# Patient Record
Sex: Male | Born: 1980 | Race: White | Hispanic: No | Marital: Married | Smoking: Never smoker
Health system: Southern US, Community
[De-identification: ages and names within clinical notes are randomized; demographics above are authoritative.]

## PROBLEM LIST (undated history)

## (undated) DIAGNOSIS — Z789 Other specified health status: Secondary | ICD-10-CM

## (undated) HISTORY — DX: Other specified health status: Z78.9

## (undated) HISTORY — PX: OTHER SURGICAL HISTORY: SHX169

---

## 2003-03-31 ENCOUNTER — Emergency Department (HOSPITAL_COMMUNITY): Admission: EM | Admit: 2003-03-31 | Discharge: 2003-03-31 | Payer: Self-pay | Admitting: Emergency Medicine

## 2005-12-12 ENCOUNTER — Emergency Department (HOSPITAL_COMMUNITY): Admission: EM | Admit: 2005-12-12 | Discharge: 2005-12-12 | Payer: Self-pay | Admitting: Emergency Medicine

## 2019-12-25 ENCOUNTER — Emergency Department (HOSPITAL_BASED_OUTPATIENT_CLINIC_OR_DEPARTMENT_OTHER): Payer: HRSA Program

## 2019-12-25 ENCOUNTER — Emergency Department (HOSPITAL_BASED_OUTPATIENT_CLINIC_OR_DEPARTMENT_OTHER)
Admission: EM | Admit: 2019-12-25 | Discharge: 2019-12-25 | Disposition: A | Discharge status: Home / Self Care | Payer: HRSA Program | Attending: Emergency Medicine | Admitting: Emergency Medicine

## 2019-12-25 ENCOUNTER — Encounter (HOSPITAL_BASED_OUTPATIENT_CLINIC_OR_DEPARTMENT_OTHER): Payer: Self-pay

## 2019-12-25 ENCOUNTER — Other Ambulatory Visit: Payer: Self-pay

## 2019-12-25 DIAGNOSIS — U071 COVID-19: Secondary | ICD-10-CM | POA: Insufficient documentation

## 2019-12-25 DIAGNOSIS — R1031 Right lower quadrant pain: Secondary | ICD-10-CM | POA: Diagnosis present

## 2019-12-25 DIAGNOSIS — R112 Nausea with vomiting, unspecified: Secondary | ICD-10-CM | POA: Diagnosis not present

## 2019-12-25 LAB — COMPREHENSIVE METABOLIC PANEL
ALT: 28 U/L (ref 0–44)
AST: 26 U/L (ref 15–41)
Albumin: 4.4 g/dL (ref 3.5–5.0)
Alkaline Phosphatase: 54 U/L (ref 38–126)
Anion gap: 8 (ref 5–15)
BUN: 15 mg/dL (ref 6–20)
CO2: 25 mmol/L (ref 22–32)
Calcium: 9 mg/dL (ref 8.9–10.3)
Chloride: 104 mmol/L (ref 98–111)
Creatinine, Ser: 1.16 mg/dL (ref 0.61–1.24)
GFR calc Af Amer: >60 mL/min (ref >60)
GFR calc non Af Amer: >60 mL/min (ref >60)
Glucose, Bld: 105 mg/dL — ABNORMAL HIGH (ref 70–99)
Potassium: 3.6 mmol/L (ref 3.5–5.1)
Sodium: 137 mmol/L (ref 135–145)
Total Bilirubin: 1.2 mg/dL (ref 0.3–1.2)
Total Protein: 7.4 g/dL (ref 6.5–8.1)

## 2019-12-25 LAB — CBC
HCT: 44.3 % (ref 39.0–52.0)
Hemoglobin: 15.6 g/dL (ref 13.0–17.0)
MCH: 31.1 pg (ref 26.0–34.0)
MCHC: 35.2 g/dL (ref 30.0–36.0)
MCV: 88.4 fL (ref 80.0–100.0)
Platelets: 195 10*3/uL (ref 150–400)
RBC: 5.01 MIL/uL (ref 4.22–5.81)
RDW: 12.2 % (ref 11.5–15.5)
WBC: 7.4 10*3/uL (ref 4.0–10.5)
nRBC: 0 % (ref 0.0–0.2)

## 2019-12-25 LAB — SARS CORONAVIRUS 2 (TAT 6-24 HRS): SARS Coronavirus 2: POSITIVE — AB

## 2019-12-25 LAB — LIPASE, BLOOD: Lipase: 23 U/L (ref 11–51)

## 2019-12-25 MED ORDER — ONDANSETRON 4 MG PO TBDP: 4.0000 mg | ORAL_TABLET | Freq: Three times a day (TID) | ORAL | 0 refills | Status: DC | PRN

## 2019-12-25 MED ORDER — SODIUM CHLORIDE 0.9 % IV BOLUS
1000.0000 mL | Freq: Once | INTRAVENOUS | Status: AC
  Administered 2019-12-25: 1000 mL via INTRAVENOUS

## 2019-12-25 MED ORDER — ONDANSETRON HCL 4 MG/2ML IJ SOLN
4.0000 mg | Freq: Once | INTRAMUSCULAR | Status: AC
  Administered 2019-12-25: 12:00:00 4 mg via INTRAVENOUS
  Filled 2019-12-25: qty 2

## 2019-12-25 MED ORDER — NAPROXEN 500 MG PO TABS: 500.0000 mg | ORAL_TABLET | Freq: Two times a day (BID) | ORAL | 0 refills | Status: DC

## 2019-12-25 MED ORDER — IOHEXOL 300 MG/ML  SOLN
100.0000 mL | Freq: Once | INTRAMUSCULAR | Status: AC | PRN
  Administered 2019-12-25: 100 mL via INTRAVENOUS

## 2019-12-25 NOTE — ED Triage Notes (Signed)
Pt reports he had his first Covid Vax on Wednesday.

## 2019-12-25 NOTE — Discharge Instructions (Addendum)
You were evaluated in the Emergency Department and after careful evaluation, we did not find any emergent condition requiring admission or further testing in the hospital.  Your exam/testing today was overall reassuring.  No signs of appendicitis on her CT scan.  Symptoms most likely related to gastroenteritis due to a virus.  Please continue home quarantine until you receive a negative coronavirus test result.  Otherwise continue hydration and use the medications provided for your symptoms as needed.  Please return to the Emergency Department if you experience any worsening of your condition.  We encourage you to follow up with a primary care provider.  Thank you for allowing [place] to be a part of your care.

## 2019-12-25 NOTE — ED Provider Notes (Signed)
[facility]PT [facility] Emergency Department Provider Note MRN:  [id number]  Arrival date & time: 12/25/19     Chief Complaint   Abdominal Pain   History of Present Illness   Joel Baldwin is a 39 y.o. year-old male with no pertinent past medical history presenting to the ED with chief complaint of abdominal pain.  Location: Right upper and lower quadrants Duration: 12 hours Onset: Sudden Timing: Current Description: Sharp Severity: Moderate Exacerbating/Alleviating Factors: None Associated Symptoms: Nausea, vomiting, diarrhea, chills, subjective fever Pertinent Negatives: Denies chest pain or shortness of breath   Review of Systems  A complete 10 system review of systems was obtained and all systems are negative except as noted in the HPI and PMH.   Patient's Health History   History reviewed. No pertinent past medical history.  History reviewed. No pertinent surgical history.  No family history on file.  Social History   Socioeconomic History  . Marital status: Married    Spouse name: Not on file  . Number of children: Not on file  . Years of education: Not on file  . Highest education level: Not on file  Occupational History  . Not on file  Tobacco Use  . Smoking status: Never Smoker  . Smokeless tobacco: Never Used  Substance and Sexual Activity  . Alcohol use: Yes    Comment: social  . Drug use: Never  . Sexual activity: Not on file  Other Topics Concern  . Not on file  Social History Narrative  . Not on file   Social Determinants of Health   Financial Resource Strain:   . Difficulty of Paying Living Expenses: Not on file  Food Insecurity:   . Worried About Programme researcher, broadcasting/film/video in the Last Year: Not on file  . Ran Out of Food in the Last Year: Not on file  Transportation Needs:   . Lack of Transportation (Medical): Not on file  . Lack of Transportation (Non-Medical): Not on file  Physical Activity:   . Days of Exercise per  Week: Not on file  . Minutes of Exercise per Session: Not on file  Stress:   . Feeling of Stress : Not on file  Social Connections:   . Frequency of Communication with Friends and Family: Not on file  . Frequency of Social Gatherings with Friends and Family: Not on file  . Attends Religious Services: Not on file  . Active Member of Clubs or Organizations: Not on file  . Attends Banker Meetings: Not on file  . Marital Status: Not on file  Intimate Partner Violence:   . Fear of Current or Ex-Partner: Not on file  . Emotionally Abused: Not on file  . Physically Abused: Not on file  . Sexually Abused: Not on file     Physical Exam   Vitals:   12/25/19 1144  BP: 113/71  Pulse: (!) 103  Resp: 20  Temp: 99.8 F (37.7 C)  SpO2: 99%    CONSTITUTIONAL: Well-appearing, NAD NEURO:  Alert and oriented x 3, no focal deficits EYES:  eyes equal and reactive ENT/NECK:  no LAD, no JVD CARDIO: Regular rate, well-perfused, normal S1 and S2 PULM:  CTAB no wheezing or rhonchi GI/GU:  normal bowel sounds, non-distended, mild right lower quadrant tenderness to palpation MSK/SPINE:  No gross deformities, no edema SKIN:  no rash, atraumatic PSYCH:  Appropriate speech and behavior  *Additional and/or pertinent findings included in MDM below  Diagnostic and Interventional Summary  EKG Interpretation  Date/Time:    Ventricular Rate:    PR Interval:    QRS Duration:   QT Interval:    QTC Calculation:   R Axis:     Text Interpretation:        Cardiac Monitoring Interpretation:  Labs Reviewed  COMPREHENSIVE METABOLIC PANEL - Abnormal; Notable for the following components:      Result Value   Glucose, Bld 105 (*)    All other components within normal limits  SARS CORONAVIRUS 2 (TAT 6-24 HRS)  CBC  LIPASE, BLOOD    CT ABDOMEN PELVIS W CONTRAST  Final Result      Medications  sodium chloride 0.9 % bolus 1,000 mL (1,000 mLs Intravenous New Bag/Given 12/25/19 1321)    ondansetron (ZOFRAN) injection 4 mg (4 mg Intravenous Given 12/25/19 1219)  iohexol (OMNIPAQUE) 300 MG/ML solution 100 mL (100 mLs Intravenous Contrast Given 12/25/19 1257)     Procedures  /  Critical Care Procedures  ED Course and Medical Decision Making  I have reviewed the triage vital signs, the nursing notes, and pertinent available records from the EMR.  Pertinent labs & imaging results that were available during my care of the patient were reviewed by me and considered in my medical decision making (see below for details).     CT to exclude appendicitis, also considering viral gastroenteritis, COVID-19 work-up pending.  2 PM update: Work-up is reassuring, labs normal, CT without acute process.  Suspect underlying gastroenteritis.  Advised continued home quarantine, patient feels well enough for discharge.  Barth Kirks. Sedonia Small, MD [place] [email or web address]  Final Clinical Impressions(s) / ED Diagnoses     ICD-10-CM   1. Nausea vomiting and diarrhea  R11.2    R19.7   2. Right lower quadrant abdominal pain  R10.31     ED Discharge Orders         Ordered    naproxen (NAPROSYN) 500 MG tablet  2 times daily     12/25/19 1408    ondansetron (ZOFRAN ODT) 4 MG disintegrating tablet  Every 8 hours PRN     12/25/19 1408           Discharge Instructions Discussed with and Provided to Patient:     Discharge Instructions     You were evaluated in the Emergency Department and after careful evaluation, we did not find any emergent condition requiring admission or further testing in the hospital.  Your exam/testing today was overall reassuring.  No signs of appendicitis on her CT scan.  Symptoms most likely related to gastroenteritis due to a virus.  Please continue home quarantine until you receive a negative coronavirus test result.  Otherwise continue hydration and use the medications provided for your symptoms as  needed.  Please return to the Emergency Department if you experience any worsening of your condition.  We encourage you to follow up with a primary care provider.  Thank you for allowing [place] to be a part of your care.        Maudie Flakes, MD 12/25/19 1410

## 2019-12-25 NOTE — ED Triage Notes (Signed)
Pt arrives to ED with reports of sudden onset RUQ pain last night from sleep. States he has had 4 episodes of vomiting. 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

## 2019-12-26 ENCOUNTER — Telehealth (HOSPITAL_COMMUNITY): Payer: Self-pay

## 2021-10-20 ENCOUNTER — Encounter (HOSPITAL_BASED_OUTPATIENT_CLINIC_OR_DEPARTMENT_OTHER): Payer: Self-pay | Admitting: Urology

## 2021-10-20 ENCOUNTER — Emergency Department (HOSPITAL_BASED_OUTPATIENT_CLINIC_OR_DEPARTMENT_OTHER)
Admission: EM | Admit: 2021-10-20 | Discharge: 2021-10-20 | Disposition: A | Discharge status: Home / Self Care | Payer: 59 | Attending: Emergency Medicine | Admitting: Emergency Medicine

## 2021-10-20 ENCOUNTER — Emergency Department (HOSPITAL_BASED_OUTPATIENT_CLINIC_OR_DEPARTMENT_OTHER): Payer: 59

## 2021-10-20 DIAGNOSIS — M25571 Pain in right ankle and joints of right foot: Secondary | ICD-10-CM | POA: Diagnosis not present

## 2021-10-20 DIAGNOSIS — S9002XA Contusion of left ankle, initial encounter: Secondary | ICD-10-CM | POA: Diagnosis not present

## 2021-10-20 DIAGNOSIS — Y9383 Activity, rough housing and horseplay: Secondary | ICD-10-CM | POA: Diagnosis not present

## 2021-10-20 DIAGNOSIS — W228XXA Striking against or struck by other objects, initial encounter: Secondary | ICD-10-CM | POA: Diagnosis not present

## 2021-10-20 DIAGNOSIS — S99912A Unspecified injury of left ankle, initial encounter: Secondary | ICD-10-CM | POA: Diagnosis not present

## 2021-10-20 DIAGNOSIS — M7989 Other specified soft tissue disorders: Secondary | ICD-10-CM | POA: Diagnosis not present

## 2021-10-20 DIAGNOSIS — S99911A Unspecified injury of right ankle, initial encounter: Secondary | ICD-10-CM | POA: Diagnosis present

## 2021-10-20 NOTE — ED Triage Notes (Signed)
Left ankle injury, hit it on bed post earlier Minimal swelling Bruising starting Unable to bear weight Ibuprofen 800 at 1800

## 2021-10-20 NOTE — ED Provider Notes (Signed)
[facility] EMERGENCY DEPARTMENT Provider Note   CSN: [id number] Arrival date & time: 10/20/21  1806     History Chief Complaint  Patient presents with   Ankle Pain    Joel Baldwin is a 40 y.o. male.   Ankle Pain  Patient presents with left ankle pain.  Happened acutely when he was roughhousing with his child on the bed.  Hit his ankle against the wood frame, the pain is been progressively worsening throughout the day.  He has taken 800 mg ibuprofen for it which has helped somewhat.  He reports it is difficult to bear weight secondary to the pain.  There is some associated swelling and bruising to the lateral side of his ankle.  The pain distribution is to the lateral and medial side, feels like a shooting pain.  History reviewed. No pertinent past medical history.  There are no problems to display for this patient.   History reviewed. No pertinent surgical history.     History reviewed. No pertinent family history.  Social History   Tobacco Use   Smoking status: Never   Smokeless tobacco: Never  Substance Use Topics   Alcohol use: Yes    Comment: social   Drug use: Never    Home Medications Prior to Admission medications   Medication Sig Start Date End Date Taking? Authorizing Provider  naproxen (NAPROSYN) 500 MG tablet Take 1 tablet (500 mg total) by mouth 2 (two) times daily. 12/25/19   Sabas Sous, MD  ondansetron (ZOFRAN ODT) 4 MG disintegrating tablet Take 1 tablet (4 mg total) by mouth every 8 (eight) hours as needed for nausea or vomiting. 12/25/19   Sabas Sous, MD    Allergies    Patient has no known allergies.  Review of Systems   Review of Systems  Musculoskeletal:  Positive for joint swelling and myalgias.   Physical Exam Updated Vital Signs BP 128/80 (BP Location: Right Arm)    Pulse 89    Temp 98 F (36.7 C) (Oral)    Resp 18    Ht 6\' 1"  (1.854 m)    Wt 97.5 kg    SpO2 100%    BMI 28.37 kg/m   Physical Exam Vitals and  nursing note reviewed. Exam conducted with a chaperone present.  Constitutional:      General: He is not in acute distress.    Appearance: Normal appearance.  HENT:     Head: Normocephalic and atraumatic.  Eyes:     General: No scleral icterus.    Extraocular Movements: Extraocular movements intact.     Pupils: Pupils are equal, round, and reactive to light.  Cardiovascular:     Pulses: Normal pulses.     Comments: DP and PT are 2+ Musculoskeletal:        General: Swelling and tenderness present.     Comments: Decreased flexion extension inversion and eversion at the ankle but he is able to tolerate passive motion.  Full range to the digits, full ROM to the knee  Skin:    Capillary Refill: Capillary refill takes less than 2 seconds.     Coloration: Skin is not jaundiced.     Findings: Bruising present.     Comments: Mild contusion to the lateral malleolus, no point tenderness.  There is some slight swelling as well.  Neurological:     Mental Status: He is alert. Mental status is at baseline.     Coordination: Coordination normal.    ED  Results / Procedures / Treatments   Labs (all labs ordered are listed, but only abnormal results are displayed) Labs Reviewed - No data to display  EKG None  Radiology DG Ankle Complete Left  Result Date: 10/20/2021 CLINICAL DATA:  Left ankle injury with swelling and bruising and unable to bear weight. EXAM: LEFT ANKLE COMPLETE - 3+ VIEW COMPARISON:  None. FINDINGS: There is no evidence of fracture, dislocation, or joint effusion. There is no evidence of arthropathy or other focal bone abnormality. Soft tissues are unremarkable. IMPRESSION: Negative. Electronically Signed   By: Burman Nieves M.D.   On: 10/20/2021 18:46    Procedures Procedures   Medications Ordered in ED Medications - No data to display  ED Course  I have reviewed the triage vital signs and the nursing notes.  Pertinent labs & imaging results that were available  during my care of the patient were reviewed by me and considered in my medical decision making (see chart for details).    MDM Rules/Calculators/A&P                         This is a 40 year old male presenting with acute ankle pain.  He is neurovascularly intact with good pulses and strong cap refill.  Able to tolerate passive range of motion, some decreased active range of motion but still mobilizes.  Not consistent with a septic joint, not consistent with gout given the mechanism.  There is some swelling and bruising, the radiograph ordered in triage is negative for an acute fracture or dislocation.  Patient was able to drive here, he can ambulate in and out.  I do not feel that the patient needs additional work-up at this time, patient offered pain medicine and declined.  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  Work note provided, patient discharged in stable condition.     Final Clinical Impression(s) / ED Diagnoses Final diagnoses:  None    Rx / DC Orders ED Discharge Orders     None        Theron Arista, Cordelia Poche 10/20/21 1916    Gloris Manchester, MD 10/22/21 [phone]

## 2021-10-20 NOTE — Discharge Instructions (Addendum)
Take 800 mg of ibuprofen 3 times daily for the pain. RICE the ankle at home. Return to work on Friday if you are feeling better.  If the pain worsens or changes severely can follow-up with orthopedic specialialist for additional work-up.

## 2022-07-16 DIAGNOSIS — E559 Vitamin D deficiency, unspecified: Secondary | ICD-10-CM | POA: Diagnosis not present

## 2022-07-16 DIAGNOSIS — Z130 Encounter for screening for diseases of the blood and blood-forming organs and certain disorders involving the immune mechanism: Secondary | ICD-10-CM | POA: Diagnosis not present

## 2022-07-16 DIAGNOSIS — Z1329 Encounter for screening for other suspected endocrine disorder: Secondary | ICD-10-CM | POA: Diagnosis not present

## 2022-07-16 DIAGNOSIS — Z13220 Encounter for screening for lipoid disorders: Secondary | ICD-10-CM | POA: Diagnosis not present

## 2022-07-16 DIAGNOSIS — Z13228 Encounter for screening for other metabolic disorders: Secondary | ICD-10-CM | POA: Diagnosis not present

## 2022-07-16 DIAGNOSIS — Z0000 Encounter for general adult medical examination without abnormal findings: Secondary | ICD-10-CM | POA: Diagnosis not present

## 2022-07-16 DIAGNOSIS — E538 Deficiency of other specified B group vitamins: Secondary | ICD-10-CM | POA: Diagnosis not present

## 2023-01-11 ENCOUNTER — Other Ambulatory Visit: Payer: Self-pay | Admitting: Nurse Practitioner

## 2023-03-09 ENCOUNTER — Ambulatory Visit: Payer: Commercial Managed Care - PPO | Admitting: Nurse Practitioner

## 2023-03-09 ENCOUNTER — Encounter: Payer: Self-pay | Admitting: Nurse Practitioner

## 2023-03-09 VITALS — BP 138/78 | HR 82 | Temp 97.7°F | Ht 72.5 in | Wt 216.8 lb

## 2023-03-09 DIAGNOSIS — R030 Elevated blood-pressure reading, without diagnosis of hypertension: Secondary | ICD-10-CM

## 2023-03-09 DIAGNOSIS — Z136 Encounter for screening for cardiovascular disorders: Secondary | ICD-10-CM | POA: Diagnosis not present

## 2023-03-09 DIAGNOSIS — D170 Benign lipomatous neoplasm of skin and subcutaneous tissue of head, face and neck: Secondary | ICD-10-CM | POA: Diagnosis not present

## 2023-03-09 DIAGNOSIS — Z0001 Encounter for general adult medical examination with abnormal findings: Secondary | ICD-10-CM

## 2023-03-09 DIAGNOSIS — E559 Vitamin D deficiency, unspecified: Secondary | ICD-10-CM

## 2023-03-09 DIAGNOSIS — E538 Deficiency of other specified B group vitamins: Secondary | ICD-10-CM

## 2023-03-09 DIAGNOSIS — Z13220 Encounter for screening for lipoid disorders: Secondary | ICD-10-CM | POA: Diagnosis not present

## 2023-03-09 DIAGNOSIS — Z131 Encounter for screening for diabetes mellitus: Secondary | ICD-10-CM

## 2023-03-09 DIAGNOSIS — Z1329 Encounter for screening for other suspected endocrine disorder: Secondary | ICD-10-CM | POA: Diagnosis not present

## 2023-03-09 DIAGNOSIS — Z7689 Persons encountering health services in other specified circumstances: Secondary | ICD-10-CM

## 2023-03-09 DIAGNOSIS — Z79899 Other long term (current) drug therapy: Secondary | ICD-10-CM

## 2023-03-09 DIAGNOSIS — Z1389 Encounter for screening for other disorder: Secondary | ICD-10-CM | POA: Diagnosis not present

## 2023-03-09 LAB — CBC WITH DIFFERENTIAL/PLATELET
Basophils Absolute: 37 cells/uL (ref 0–200)
Eosinophils Absolute: 186 cells/uL (ref 15–500)
Eosinophils Relative: 3 %
HCT: 47.3 % (ref 38.5–50.0)
Lymphs Abs: 2058 cells/uL (ref 850–3900)
MCV: 89.4 fL (ref 80.0–100.0)
MPV: 10.1 fL (ref 7.5–12.5)
Neutrophils Relative %: 54.9 %
RBC: 5.29 10*6/uL (ref 4.20–5.80)
RDW: 13.1 % (ref 11.0–15.0)
Total Lymphocyte: 33.2 %
WBC: 6.2 10*3/uL (ref 3.8–10.8)

## 2023-03-09 NOTE — Progress Notes (Signed)
New Patient Office Visit  Joel Baldwin was seen today to establish care.  Below notes Plan of Care:  1. Encounter to establish care  - CBC with Differential/Platelet - COMPLETE METABOLIC PANEL WITH GFR  2. Encounter for general adult medical examination with abnormal findings Health  maintenance reviewed  - EKG 12-Lead - CBC with Differential/Platelet - COMPLETE METABOLIC PANEL WITH GFR - Lipid panel - TSH - Hemoglobin A1c - Insulin, random - VITAMIN D 25 Hydroxy (Vit-D Deficiency, Fractures) - Urinalysis, Routine w reflex microscopic - Microalbumin / creatinine urine ratio - Vitamin B12  3. Screening for cholesterol level Discussed lifestyle modifications. Recommended diet heavy in fruits and veggies, omega 3's. Decrease consumption of animal meats, cheeses, and dairy products. Remain active and exercise as tolerated.  - Lipid panel  4. Screening for diabetes mellitus (DM) Education: Reviewed 'ABCs' of diabetes management  Discussed goals to be met and/or maintained include A1C (<7) Blood pressure (<130/80) Cholesterol (LDL <70) Continue Eye Exam yearly  Continue Dental Exam Q6 mo Discussed dietary recommendations Discussed Physical Activity recommendations  - Hemoglobin A1c - Insulin, random  5. Screening for thyroid disorder  - TSH  6. Screening for hematuria or proteinuria  - Urinalysis, Routine w reflex microscopic - Microalbumin / creatinine urine ratio  7. Screening for cardiovascular condition  - EKG 12-Lead  8. Elevated blood pressure reading without diagnosis of hypertension Discussed DASH (Dietary Approaches to Stop Hypertension) DASH diet is lower in sodium than a typical American diet. Cut back on foods that are high in saturated fat, cholesterol, and trans fats. Eat more whole-grain foods, fish, poultry, and nuts Remain active and exercise as tolerated daily.  Monitor BP at home-Call if greater than 130/80.   9. Lipoma of  face Follows with Dermatology  Vitamin D deficiency Continue supplement for goal of 60-100 Monitor Vitamin D levels  - VITAMIN D 25 Hydroxy (Vit-D Deficiency, Fractures)  11. B12 deficiency  - Vitamin B12  12. Medication management All medications discussed and reviewed in full. All questions and concerns regarding medications addressed.    - EKG 12-Lead - CBC with Differential/Platelet - COMPLETE METABOLIC PANEL WITH GFR - Lipid panel - TSH - Hemoglobin A1c - Insulin, random - VITAMIN D 25 Hydroxy (Vit-D Deficiency, Fractures) - Urinalysis, Routine w reflex microscopic - Microalbumin / creatinine urine ratio - Vitamin B12   Notify office for further evaluation and treatment, questions or concerns if any reported s/s fail to improve.   The patient was advised to call back or seek an in-person evaluation if any symptoms worsen or if the condition fails to improve as anticipated.   Further disposition pending results of labs. Discussed med's effects and SE's.    I discussed the assessment and treatment plan with the patient. The patient was provided an opportunity to ask questions and all were answered. The patient agreed with the plan and demonstrated an understanding of the instructions.  Discussed med's effects and SE's. Screening labs and tests as requested with regular follow-up as recommended.  I provided 35 minutes of face-to-face time during this encounter including counseling, chart review, and critical decision making was preformed.  Today's Plan of Care is based on a patient-centered health care approach known as shared decision making - the decisions, tests and treatments allow for patient preferences and values to be balanced with clinical evidence.     Subjective    Patient ID: Joel Baldwin, male    DOB: Nov 30, 1980  Age: 42 y.o. 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  He has no new concerns today.    Outpatient Encounter Medications as of 03/09/2023  Medication Sig   naproxen (NAPROSYN) 500 MG tablet Take 1 tablet (500 mg total) by mouth 2 (two) times daily.   ondansetron (ZOFRAN ODT) 4 MG disintegrating tablet Take 1 tablet (4 mg total) by mouth every 8 (eight) hours as needed for nausea or vomiting.   No facility-administered encounter medications on file as of 03/09/2023.    No past medical history on file.  No past surgical history on file.  No family history on file.   Social History   Socioeconomic History   Marital status: Married    Spouse name: Not on file   Number of children: Not on file   Years of education: Not on file   Highest education level: Not on file  Occupational History   Not on file  Tobacco Use   Smoking status: Never   Smokeless tobacco: Never  Substance and Sexual Activity   Alcohol use: Yes    Comment: social   Drug use: Never   Sexual activity: Not on file  Other Topics Concern   Not on file  Social History Narrative   Not on file   Social Determinants of Health   Financial Resource Strain: Not on file  Food Insecurity: Not on file  Transportation Needs: Not on file  Physical Activity: Not on file  Stress: Not on file  Social Connections: Not on file  Intimate Partner Violence: Not on file    Review of Systems  Constitutional: Negative.   HENT: Negative.    Eyes: Negative.   Respiratory: Negative.    Cardiovascular: Negative.   Gastrointestinal: Negative.   Genitourinary: Negative.   Musculoskeletal: Negative.   Skin: Negative.   Neurological: Negative.   Endo/Heme/Allergies: Negative.   Psychiatric/Behavioral: Negative.      Objective    BP 138/78   Pulse 82   Temp 97.7 F (36.5 C)   Ht 6' 0.5" (1.842 m)   Wt 216 lb 12.8 oz (98.3 kg)   SpO2 98%   BMI 29.00 kg/m   Physical Exam Constitutional:       Appearance: Normal appearance.  HENT:     Head: Normocephalic and atraumatic.     Right Ear: Tympanic membrane and ear canal normal.     Left Ear: Tympanic membrane and ear canal normal.  Neurological:     Mental Status: He is alert.    EKG: NSR   No follow-ups on file.   Adela Glimpse, NP

## 2023-03-10 ENCOUNTER — Encounter: Payer: Self-pay | Admitting: Nurse Practitioner

## 2023-03-10 LAB — URINALYSIS, ROUTINE W REFLEX MICROSCOPIC
Bilirubin Urine: NEGATIVE
Glucose, UA: NEGATIVE
Hgb urine dipstick: NEGATIVE
Ketones, ur: NEGATIVE
Leukocytes,Ua: NEGATIVE
Nitrite: NEGATIVE
Protein, ur: NEGATIVE
Specific Gravity, Urine: 1.023 (ref 1.001–1.035)
pH: 6 (ref 5.0–8.0)

## 2023-03-10 LAB — LIPID PANEL
Cholesterol: 171 mg/dL (ref <200)
HDL: 31 mg/dL — ABNORMAL LOW (ref >=40)
LDL Cholesterol (Calc): 95 mg/dL (calc)
Non-HDL Cholesterol (Calc): 140 mg/dL (calc) — ABNORMAL HIGH (ref <130)
Total CHOL/HDL Ratio: 5.5 (calc) — ABNORMAL HIGH (ref <5.0)
Triglycerides: 340 mg/dL — ABNORMAL HIGH (ref <150)

## 2023-03-10 LAB — COMPLETE METABOLIC PANEL WITH GFR
AG Ratio: 2 (calc) (ref 1.0–2.5)
ALT: 21 U/L (ref 9–46)
AST: 25 U/L (ref 10–40)
Albumin: 4.7 g/dL (ref 3.6–5.1)
Alkaline phosphatase (APISO): 59 U/L (ref 36–130)
BUN: 21 mg/dL (ref 7–25)
CO2: 25 mmol/L (ref 20–32)
Calcium: 9.8 mg/dL (ref 8.6–10.3)
Chloride: 107 mmol/L (ref 98–110)
Creat: 1.17 mg/dL (ref 0.60–1.29)
Globulin: 2.4 g/dL (calc) (ref 1.9–3.7)
Glucose, Bld: 98 mg/dL (ref 65–99)
Potassium: 4.1 mmol/L (ref 3.5–5.3)
Sodium: 143 mmol/L (ref 135–146)
Total Bilirubin: 0.4 mg/dL (ref 0.2–1.2)
Total Protein: 7.1 g/dL (ref 6.1–8.1)
eGFR: 81 mL/min/{1.73_m2} (ref >=60)

## 2023-03-10 LAB — INSULIN, RANDOM: Insulin: 33.1 u[IU]/mL — ABNORMAL HIGH

## 2023-03-10 LAB — CBC WITH DIFFERENTIAL/PLATELET
Absolute Monocytes: 515 cells/uL (ref 200–950)
Basophils Relative: 0.6 %
Hemoglobin: 16.4 g/dL (ref 13.2–17.1)
MCH: 31 pg (ref 27.0–33.0)
MCHC: 34.7 g/dL (ref 32.0–36.0)
Monocytes Relative: 8.3 %
Neutro Abs: 3404 cells/uL (ref 1500–7800)
Platelets: 226 10*3/uL (ref 140–400)

## 2023-03-10 LAB — TSH: TSH: 1.21 mIU/L (ref 0.40–4.50)

## 2023-03-10 LAB — MICROALBUMIN / CREATININE URINE RATIO
Creatinine, Urine: 163 mg/dL (ref 20–320)
Microalb, Ur: <0.2 mg/dL

## 2023-03-10 LAB — VITAMIN D 25 HYDROXY (VIT D DEFICIENCY, FRACTURES): Vit D, 25-Hydroxy: 25 ng/mL — ABNORMAL LOW (ref 30–100)

## 2023-03-10 LAB — HEMOGLOBIN A1C
Hgb A1c MFr Bld: 5 % of total Hgb (ref <5.7)
Mean Plasma Glucose: 97 mg/dL
eAG (mmol/L): 5.4 mmol/L

## 2023-03-10 LAB — VITAMIN B12: Vitamin B-12: 251 pg/mL (ref 200–1100)

## 2023-03-15 ENCOUNTER — Encounter: Payer: Self-pay | Admitting: Nurse Practitioner

## 2023-03-15 DIAGNOSIS — D170 Benign lipomatous neoplasm of skin and subcutaneous tissue of head, face and neck: Secondary | ICD-10-CM | POA: Insufficient documentation

## 2023-03-15 DIAGNOSIS — R030 Elevated blood-pressure reading, without diagnosis of hypertension: Secondary | ICD-10-CM | POA: Insufficient documentation

## 2023-05-05 ENCOUNTER — Other Ambulatory Visit: Payer: Self-pay | Admitting: Oncology

## 2023-05-05 DIAGNOSIS — Z006 Encounter for examination for normal comparison and control in clinical research program: Secondary | ICD-10-CM

## 2023-08-06 IMAGING — DX DG ANKLE COMPLETE 3+V*L*
3 series · 3 of 3 positions shown · non-contrast
Comparison: None.

CLINICAL DATA: Left ankle injury with swelling and bruising and
unable to bear weight.

EXAM:
LEFT ANKLE COMPLETE - 3+ VIEW

[ankle ap]
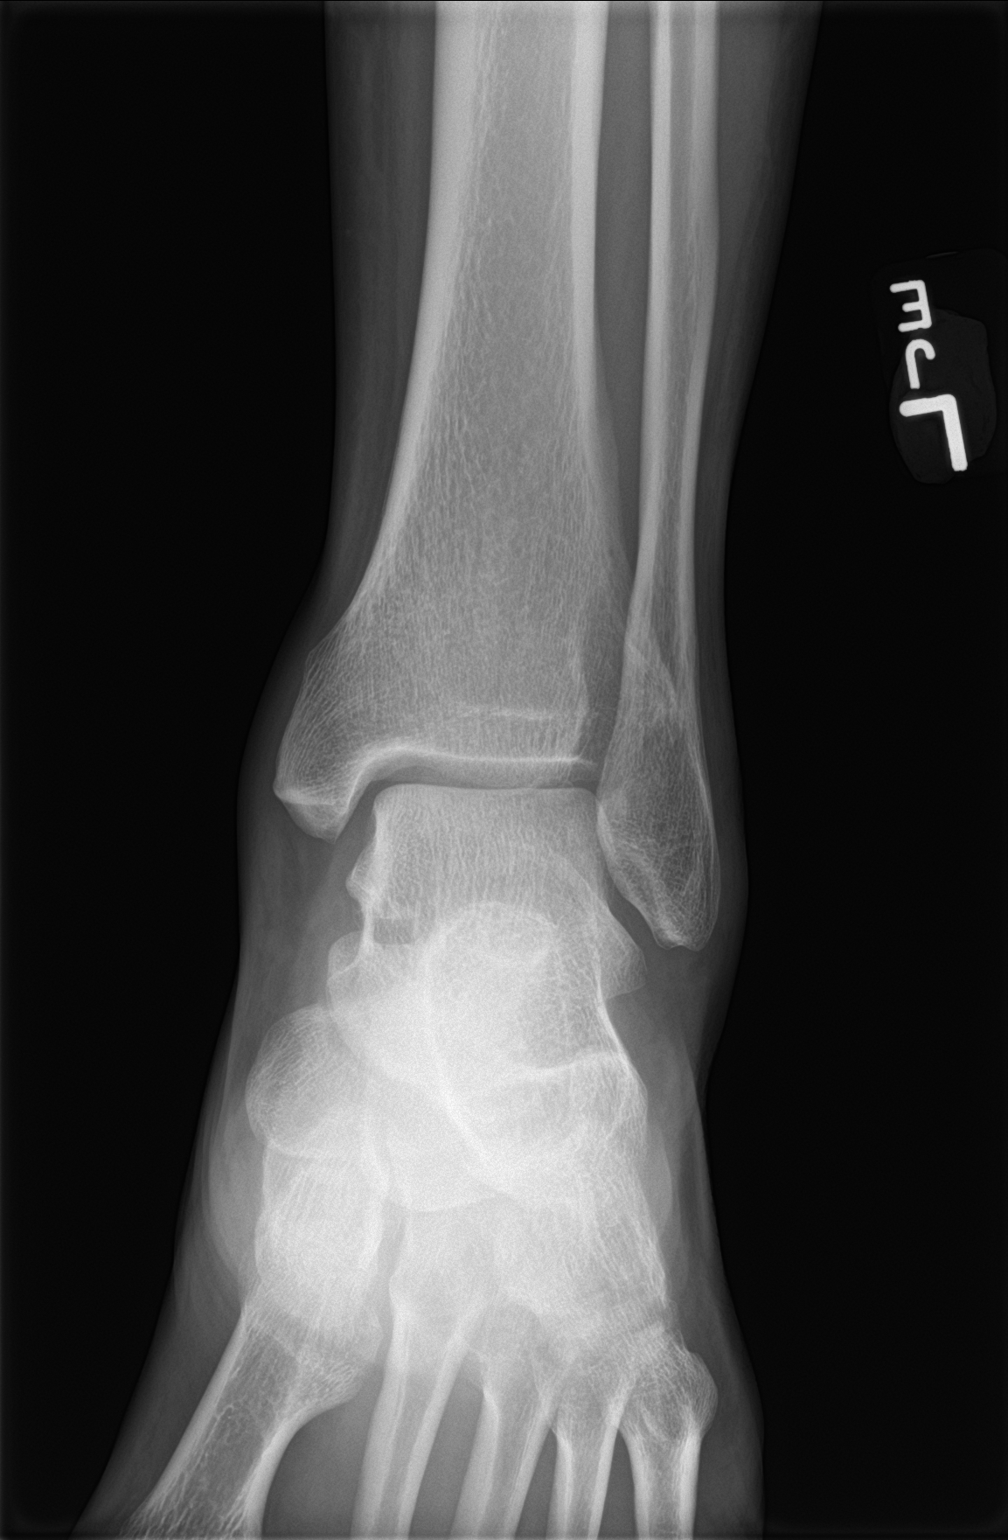

[ankle obl]
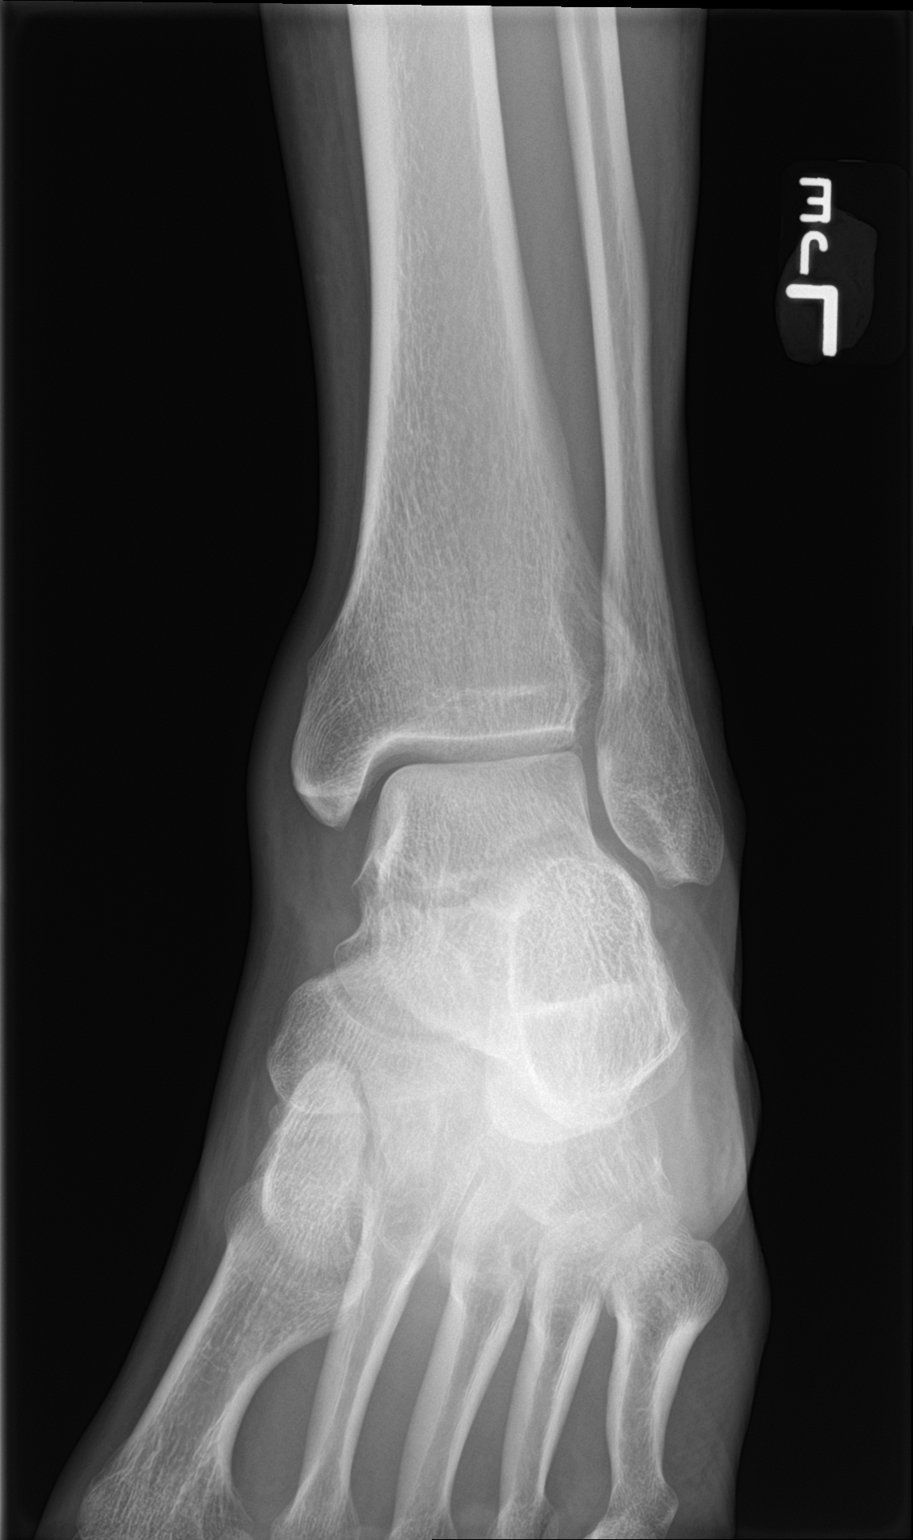

[ankle lat]
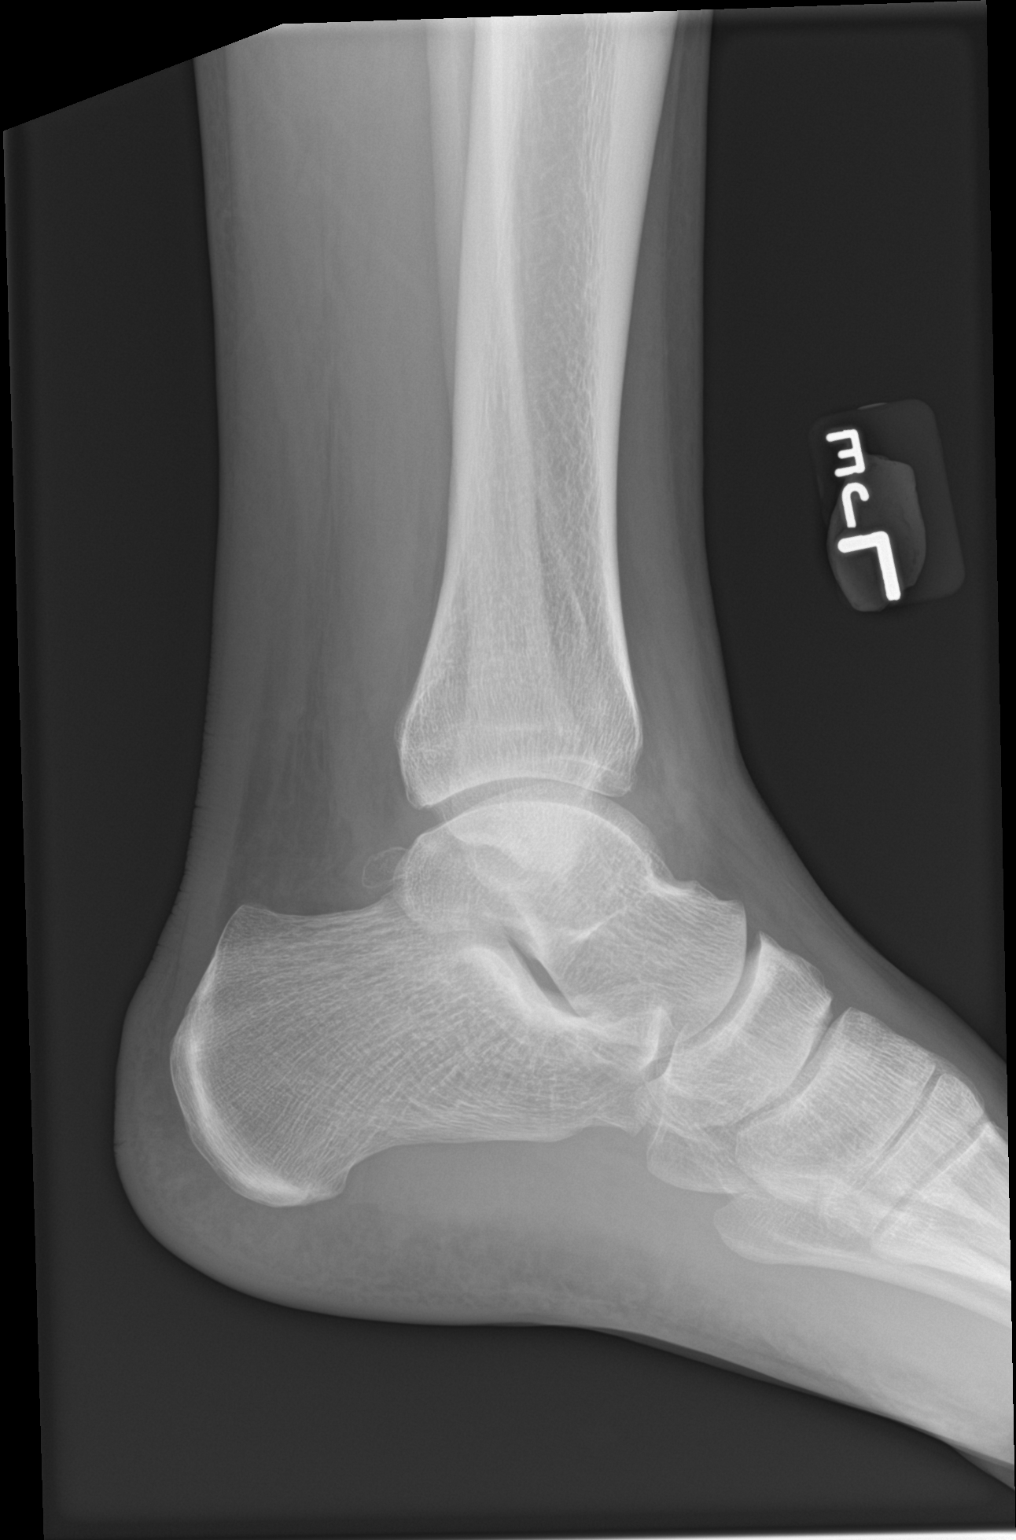

[3 of 3 positions shown; findings below may reference images not displayed]

FINDINGS: There is no evidence of fracture, dislocation, or joint effusion.
There is no evidence of arthropathy or other focal bone abnormality.
Soft tissues are unremarkable.
IMPRESSION: Negative.

## 2023-08-31 ENCOUNTER — Other Ambulatory Visit (HOSPITAL_COMMUNITY): Payer: Self-pay

## 2023-09-09 ENCOUNTER — Other Ambulatory Visit (HOSPITAL_COMMUNITY)
Admission: RE | Admit: 2023-09-09 | Discharge: 2023-09-09 | Disposition: A | Discharge status: Home / Self Care | Payer: Self-pay | Source: Ambulatory Visit | Attending: Oncology | Admitting: Oncology

## 2023-09-09 DIAGNOSIS — Z006 Encounter for examination for normal comparison and control in clinical research program: Secondary | ICD-10-CM | POA: Insufficient documentation

## 2023-09-21 LAB — GENECONNECT MOLECULAR SCREEN: Genetic Analysis Overall Interpretation: NEGATIVE

## 2024-01-26 ENCOUNTER — Encounter: Payer: Self-pay | Admitting: Physician Assistant

## 2024-02-02 ENCOUNTER — Encounter: Payer: Self-pay | Admitting: Physician Assistant

## 2024-02-02 ENCOUNTER — Ambulatory Visit: Admitting: Physician Assistant

## 2024-02-02 VITALS — BP 126/78 | HR 67 | Ht 72.5 in | Wt 215.6 lb

## 2024-02-02 DIAGNOSIS — R351 Nocturia: Secondary | ICD-10-CM

## 2024-02-02 DIAGNOSIS — R3915 Urgency of urination: Secondary | ICD-10-CM | POA: Diagnosis not present

## 2024-02-02 LAB — POCT URINALYSIS DIP (MANUAL ENTRY)
Bilirubin, UA: NEGATIVE
Blood, UA: NEGATIVE
Glucose, UA: NEGATIVE mg/dL
Ketones, POC UA: NEGATIVE mg/dL
Nitrite, UA: NEGATIVE
Protein Ur, POC: NEGATIVE mg/dL
Spec Grav, UA: <=1.005 — AB (ref 1.010–1.025)
Urobilinogen, UA: 0.2 U/dL
pH, UA: 6 (ref 5.0–8.0)

## 2024-02-02 LAB — PSA: PSA: 0.36 ng/mL (ref 0.10–4.00)

## 2024-02-02 NOTE — Progress Notes (Signed)
 New patient visit   Patient: Joel Baldwin   DOB: September 23, 1981   43 y.o. Male  MRN: [id number] Visit Date: 02/02/2024  Today's healthcare provider: Alfredia Ferguson, PA-C   Cc. New patient, establish care  Subjective    Joel Baldwin is a 43 y.o. male who presents today as a new patient to establish care.   Discussed the use of AI scribe software for clinical note transcription with the patient, who gave verbal consent to proceed.  History of Present Illness   The patient, a nurse, presents with a chronic issue of urinary urgency that has been ongoing for about five years. The patient reports an immediate need to urinate, often having to go to the restroom right away. This urgency is not dependent on the situation and occurs daily. The patient also reports waking up two to three times at night to urinate, which has been affecting his sleep cycle for the past year. The patient denies any issues with the urinary stream or bowel movements. The patient does not take any medications and has no surgical history. The patient does not smoke or vape.       History reviewed. No pertinent past medical history. History reviewed. No pertinent surgical history. Family Status  Relation Name Status   Mother  Alive   Father  Alive   MGM  (Not Specified)   MGF  (Not Specified)  No partnership data on file   Family History  Problem Relation Age of Onset   Breast cancer Maternal Grandmother    Cancer Maternal Grandfather        lungs, met to brain   Social History   Socioeconomic History   Marital status: Married    Spouse name: Not on file   Number of children: Not on file   Years of education: Not on file   Highest education level: Bachelor's degree (e.g., BA, AB, BS)  Occupational History   Not on file  Tobacco Use   Smoking status: Never   Smokeless tobacco: Never  Vaping Use   Vaping status: Never Used  Substance and Sexual Activity   Alcohol use: Yes    Alcohol/week:  4.0 standard drinks of alcohol    Types: 2 Cans of beer, 2 Standard drinks or equivalent per week    Comment: social   Drug use: Never   Sexual activity: Yes    Birth control/protection: None  Other Topics Concern   Not on file  Social History Narrative   Not on file   Social Drivers of Health   Financial Resource Strain: Low Risk  (02/02/2024)   Overall Financial Resource Strain (CARDIA)    Difficulty of Paying Living Expenses: Not hard at all  Food Insecurity: No Food Insecurity (02/02/2024)   Hunger Vital Sign    Worried About Running Out of Food in the Last Year: Never true    Ran Out of Food in the Last Year: Never true  Transportation Needs: No Transportation Needs (02/02/2024)   PRAPARE - Administrator, Civil Service (Medical): No    Lack of Transportation (Non-Medical): No  Physical Activity: Sufficiently Active (02/02/2024)   Exercise Vital Sign    Days of Exercise per Week: 5 days    Minutes of Exercise per Session: 40 min  Stress: No Stress Concern Present (02/02/2024)   [employer] of Occupational Health - Occupational Stress Questionnaire    Feeling of Stress : Not at all  Social Connections: Moderately Isolated (  02/02/2024)   Social Connection and Isolation Panel [NHANES]    Frequency of Communication with Friends and Family: More than three times a week    Frequency of Social Gatherings with Friends and Family: Twice a week    Attends Religious Services: Never    Database administrator or Organizations: No    Attends Engineer, structural: Not on file    Marital Status: Married   No outpatient medications prior to visit.   No facility-administered medications prior to visit.   No Known Allergies  Immunization History  Administered Date(s) Administered   Tdap 01/30/2013    Health Maintenance  Topic Date Due   COVID-19 Vaccine (1) Never done   HIV Screening  Never done   Hepatitis C Screening  Never done   DTaP/Tdap/Td (2 - Td or  Tdap) 01/31/2023   INFLUENZA VACCINE  05/26/2024   HPV VACCINES  Aged Out    Patient Care Team: Lucky Cowboy, MD as PCP - General (Internal Medicine)  Review of Systems  Constitutional:  Negative for fatigue and fever.  Respiratory:  Negative for cough and shortness of breath.   Cardiovascular:  Negative for chest pain, palpitations and leg swelling.  Genitourinary:  Positive for urgency.  Neurological:  Negative for dizziness and headaches.        Objective    BP 126/78   Pulse 67   Ht 6' 0.5" (1.842 m)   Wt 215 lb 9.6 oz (97.8 kg)   BMI 28.84 kg/m    Physical Exam Constitutional:      General: He is awake.     Appearance: He is well-developed.  HENT:     Head: Normocephalic.  Eyes:     Conjunctiva/sclera: Conjunctivae normal.  Cardiovascular:     Rate and Rhythm: Normal rate and regular rhythm.     Heart sounds: Normal heart sounds.  Pulmonary:     Effort: Pulmonary effort is normal.     Breath sounds: Normal breath sounds.  Skin:    General: Skin is warm.  Neurological:     Mental Status: He is alert and oriented to person, place, and time.  Psychiatric:        Attention and Perception: Attention normal.        Mood and Affect: Mood normal.        Speech: Speech normal.        Behavior: Behavior is cooperative.     Depression Screen     No data to display         No results found for any visits on 02/02/24.  Assessment & Plan     Nocturia -     PSA -     POCT urinalysis dipstick -     Ambulatory referral to Urology  Urinary urgency -     PSA -     POCT urinalysis dipstick -     Ambulatory referral to Urology   Chronic urinary urgency and nocturia for approximately five years, with increased frequency over the past year.  - Order PSA and urinalysis - Refer to urology for further evaluation    Return in about 3 months (around 05/03/2024) for CPE.      Alfredia Ferguson, PA-C  [facility] at [facility] [phone] (phone) [phone] (fax)  [facility]

## 2024-02-03 ENCOUNTER — Encounter: Payer: Self-pay | Admitting: Physician Assistant

## 2024-02-03 LAB — URINE CULTURE
MICRO NUMBER:: 16308409
Result:: NO GROWTH
SPECIMEN QUALITY:: ADEQUATE

## 2024-02-18 ENCOUNTER — Ambulatory Visit: Admitting: Urology

## 2024-02-18 ENCOUNTER — Encounter: Payer: Self-pay | Admitting: Urology

## 2024-02-18 VITALS — BP 127/76 | HR 71 | Ht 72.0 in | Wt 210.0 lb

## 2024-02-18 DIAGNOSIS — N3281 Overactive bladder: Secondary | ICD-10-CM | POA: Diagnosis not present

## 2024-02-18 DIAGNOSIS — R399 Unspecified symptoms and signs involving the genitourinary system: Secondary | ICD-10-CM

## 2024-02-18 LAB — URINALYSIS, ROUTINE W REFLEX MICROSCOPIC
Bilirubin, UA: NEGATIVE
Glucose, UA: NEGATIVE
Ketones, UA: NEGATIVE
Leukocytes,UA: NEGATIVE
Nitrite, UA: NEGATIVE
Protein,UA: NEGATIVE
RBC, UA: NEGATIVE
Specific Gravity, UA: 1.015 (ref 1.005–1.030)
Urobilinogen, Ur: 0.2 mg/dL (ref 0.2–1.0)
pH, UA: 5.5 (ref 5.0–7.5)

## 2024-02-18 LAB — BLADDER SCAN AMB NON-IMAGING

## 2024-02-18 MED ORDER — GEMTESA 75 MG PO TABS: 75.0000 mg | ORAL_TABLET | Freq: Every day | ORAL | Status: DC

## 2024-02-18 NOTE — Progress Notes (Signed)
 Assessment: 1. Lower urinary tract symptoms (LUTS)   2. OAB (overactive bladder)     Plan: I personally reviewed the patient's chart including provider notes, and lab results. Diagnosis and management of overactive bladder discussed with the patient in detail today.  Options for management including avoidance of dietary irritants, behavioral modification, medical therapy, neuromodulation, and chemodenervation discussed. Bladder diet sheet given. Trial of Gemtesa  75 mg daily. Samples given. Return to office in 1 month  Chief Complaint:  Chief Complaint  Patient presents with   LUTS    History of Present Illness:  Joel Baldwin is a 43 y.o. male who is seen in consultation from [place], [place] for evaluation of lower urinary tract symptoms. He has had symptoms for approximately 5 years.  He reports urinary frequency voiding every 2 hours, significant urgency, and nocturia x 2.  He voids with a good stream and feels like he empties his bladder completely.  No dysuria or gross hematuria.  The urgency is the most problematic for him.  He has not had any incontinence.  No history of UTIs or prostatitis.  No prior medical therapy for his symptoms. IPSS = 11/4 today.  PSA 4/25:  0.36  He primarily drinks water.  He does not have significant caffeine intake.  Past Medical History:  Past Medical History:  Diagnosis Date   No known problems     Past Surgical History:  Past Surgical History:  Procedure Laterality Date   None      Allergies:  No Known Allergies  Family History:  Family History  Problem Relation Age of Onset   Breast cancer Maternal Grandmother    Cancer Maternal Grandfather        lungs, met to brain    Social History:  Social History   Tobacco Use   Smoking status: Never   Smokeless tobacco: Never  Vaping Use   Vaping status: Never Used  Substance Use Topics   Alcohol use: Yes    Alcohol/week: 4.0 standard drinks of alcohol    Types: 2  Cans of beer, 2 Standard drinks or equivalent per week    Comment: social   Drug use: Never    Review of symptoms:  Constitutional:  Negative for unexplained weight loss, night sweats, fever, chills ENT:  Negative for nose bleeds, sinus pain, painful swallowing CV:  Negative for chest pain, shortness of breath, exercise intolerance, palpitations, loss of consciousness Resp:  Negative for cough, wheezing, shortness of breath GI:  Negative for nausea, vomiting, diarrhea, bloody stools GU:  Positives noted in HPI; otherwise negative for gross hematuria, dysuria, urinary incontinence Neuro:  Negative for seizures, poor balance, limb weakness, slurred speech Psych:  Negative for lack of energy, depression, anxiety Endocrine:  Negative for polydipsia, polyuria, symptoms of hypoglycemia (dizziness, hunger, sweating) Hematologic:  Negative for anemia, purpura, petechia, prolonged or excessive bleeding, use of anticoagulants  Allergic:  Negative for difficulty breathing or choking as a result of exposure to anything; no shellfish allergy; no allergic response (rash/itch) to materials, foods  Physical exam: BP 127/76   Pulse 71   Ht 6' (1.829 m)   Wt 210 lb (95.3 kg)   BMI 28.48 kg/m  GENERAL APPEARANCE:  Well appearing, well developed, well nourished, NAD HEENT: Atraumatic, Normocephalic, oropharynx clear. NECK: Supple without lymphadenopathy or thyromegaly. LUNGS: Clear to auscultation bilaterally. HEART: Regular Rate and Rhythm without murmurs, gallops, or rubs. ABDOMEN: Soft, non-tender, No Masses. EXTREMITIES: Moves all extremities well.  Without clubbing, cyanosis,  or edema. NEUROLOGIC:  Alert and oriented x 3, normal gait, CN II-XII grossly intact.  MENTAL STATUS:  Appropriate. BACK:  Non-tender to palpation.  No CVAT SKIN:  Warm, dry and intact.   GU: Penis:  circumcised Meatus: Normal Scrotum: normal, no masses Testis: normal without masses bilateral Epididymis:  normal   Results: U/A: negative  PVR =  1 ml

## 2024-03-13 ENCOUNTER — Encounter: Payer: Commercial Managed Care - PPO | Admitting: Nurse Practitioner

## 2024-04-04 ENCOUNTER — Encounter: Payer: Self-pay | Admitting: Urology

## 2024-04-04 ENCOUNTER — Ambulatory Visit (INDEPENDENT_AMBULATORY_CARE_PROVIDER_SITE_OTHER): Admitting: Urology

## 2024-04-04 VITALS — BP 123/78 | HR 61 | Ht 73.0 in | Wt 215.0 lb

## 2024-04-04 DIAGNOSIS — N3281 Overactive bladder: Secondary | ICD-10-CM

## 2024-04-04 DIAGNOSIS — R399 Unspecified symptoms and signs involving the genitourinary system: Secondary | ICD-10-CM

## 2024-04-04 LAB — URINALYSIS, ROUTINE W REFLEX MICROSCOPIC
Bilirubin, UA: NEGATIVE
Glucose, UA: NEGATIVE
Ketones, UA: NEGATIVE
Leukocytes,UA: NEGATIVE
Nitrite, UA: NEGATIVE
Protein,UA: NEGATIVE
RBC, UA: NEGATIVE
Specific Gravity, UA: 1.01 (ref 1.005–1.030)
Urobilinogen, Ur: 0.2 mg/dL (ref 0.2–1.0)
pH, UA: 6 (ref 5.0–7.5)

## 2024-04-04 MED ORDER — GEMTESA 75 MG PO TABS: 75.0000 mg | ORAL_TABLET | Freq: Every day | ORAL | 5 refills | Status: DC

## 2024-04-04 NOTE — Progress Notes (Signed)
 Assessment: 1. Lower urinary tract symptoms (LUTS)   2. OAB (overactive bladder)     Plan: Continue bladder diet  Will observe for any recurrence of symptoms at this time. I gave him 2 weeks of samples of Gemtesa  as well as a prescription if he has recurrence of his symptoms and needs to restart the medication. Return to office as needed.  Chief Complaint:  Chief Complaint  Patient presents with   Over Active Bladder    History of Present Illness:  Joel Baldwin is a 43 y.o. male who is seen for further evaluation of lower urinary tract symptoms. At his initial visit in April 2025, he reported lower urinary tract symptoms for approximately 5 years.  Symptoms include urinary frequency voiding every 2 hours, significant urgency, and nocturia x 2.  He voided with a good stream and felt like he emptied his bladder completely.  No dysuria or gross hematuria.  The urgency was the most problematic symptom for him.  No urinary incontinence.  No history of UTIs or prostatitis.  No prior medical therapy for his symptoms. IPSS = 11/4. PVR = 1 ml  PSA 4/25:  0.36  He primarily drinks water.  He does not have significant caffeine intake.  He was given a trial of Gemtesa  75 mg daily in April 2025.  He returns today for follow-up.  He ran out of the Gemtesa  approximately 1 week ago.  He has not seen any recurrence of his lower urinary tract symptoms despite being off of the medication.  He is not having any significant frequency or urgency.  He continues with nocturia x 2.  No dysuria or gross hematuria. IPSS = 9/2  Portions of the above documentation were copied from a prior visit for review purposes only.   Past Medical History:  Past Medical History:  Diagnosis Date   No known problems     Past Surgical History:  Past Surgical History:  Procedure Laterality Date   None      Allergies:  No Known Allergies  Family History:  Family History  Problem Relation Age of Onset    Breast cancer Maternal Grandmother    Cancer Maternal Grandfather        lungs, met to brain    Social History:  Social History   Tobacco Use   Smoking status: Never   Smokeless tobacco: Never  Vaping Use   Vaping status: Never Used  Substance Use Topics   Alcohol use: Yes    Alcohol/week: 4.0 standard drinks of alcohol    Types: 2 Cans of beer, 2 Standard drinks or equivalent per week    Comment: social   Drug use: Never    ROS: Constitutional:  Negative for fever, chills, weight loss CV: Negative for chest pain, previous MI, hypertension Respiratory:  Negative for shortness of breath, wheezing, sleep apnea, frequent cough GI:  Negative for nausea, vomiting, bloody stool, GERD  Physical exam: BP 123/78   Pulse 61   Ht 6\' 1"  (1.854 m)   Wt 215 lb (97.5 kg)   BMI 28.37 kg/m  GENERAL APPEARANCE:  Well appearing, well developed, well nourished, NAD HEENT:  Atraumatic, normocephalic, oropharynx clear NECK:  Supple without lymphadenopathy or thyromegaly ABDOMEN:  Soft, non-tender, no masses EXTREMITIES:  Moves all extremities well, without clubbing, cyanosis, or edema NEUROLOGIC:  Alert and oriented x 3, normal gait, CN II-XII grossly intact MENTAL STATUS:  appropriate BACK:  Non-tender to palpation, No CVAT SKIN:  Warm, dry, and  intact   Results: U/A: negative

## 2024-05-01 NOTE — Progress Notes (Unsigned)
 Complete physical exam   Patient: Joel Baldwin   DOB: 1981-06-05   43 y.o. Male  MRN: [id number] Visit Date: 05/02/2024  Today's healthcare provider: Manuelita Flatness, PA-C   No chief complaint on file.  Subjective    Joel Baldwin is a 43 y.o. male who presents today for a complete physical exam.   ***  Past Medical History:  Diagnosis Date   No known problems    Past Surgical History:  Procedure Laterality Date   None     Social History   Socioeconomic History   Marital status: Married    Spouse name: Not on file   Number of children: Not on file   Years of education: Not on file   Highest education level: Bachelor's degree (e.g., BA, AB, BS)  Occupational History   Not on file  Tobacco Use   Smoking status: Never   Smokeless tobacco: Never  Vaping Use   Vaping status: Never Used  Substance and Sexual Activity   Alcohol use: Yes    Alcohol/week: 4.0 standard drinks of alcohol    Types: 2 Cans of beer, 2 Standard drinks or equivalent per week    Comment: social   Drug use: Never   Sexual activity: Yes    Birth control/protection: None  Other Topics Concern   Not on file  Social History Narrative   Not on file   Social Drivers of Health   Financial Resource Strain: Low Risk  (02/02/2024)   Overall Financial Resource Strain (CARDIA)    Difficulty of Paying Living Expenses: Not hard at all  Food Insecurity: No Food Insecurity (02/02/2024)   Hunger Vital Sign    Worried About Running Out of Food in the Last Year: Never true    Ran Out of Food in the Last Year: Never true  Transportation Needs: No Transportation Needs (02/02/2024)   PRAPARE - Administrator, Civil Service (Medical): No    Lack of Transportation (Non-Medical): No  Physical Activity: Sufficiently Active (02/02/2024)   Exercise Vital Sign    Days of Exercise per Week: 5 days    Minutes of Exercise per Session: 40 min  Stress: No Stress Concern Present (02/02/2024)   [employer] of Occupational Health - Occupational Stress Questionnaire    Feeling of Stress : Not at all  Social Connections: Moderately Isolated (02/02/2024)   Social Connection and Isolation Panel    Frequency of Communication with Friends and Family: More than three times a week    Frequency of Social Gatherings with Friends and Family: Twice a week    Attends Religious Services: Never    Database administrator or Organizations: No    Attends Engineer, structural: Not on file    Marital Status: Married  Catering manager Violence: Not on file   Family Status  Relation Name Status   Mother  Alive   Father  Alive   MGM  (Not Specified)   MGF  (Not Specified)  No partnership data on file   Family History  Problem Relation Age of Onset   Breast cancer Maternal Grandmother    Cancer Maternal Grandfather        lungs, met to brain   No Known Allergies  Patient Care Team: Flatness Manuelita, PA-C as PCP - General (Physician Assistant)   Medications: Outpatient Medications Prior to Visit  Medication Sig   Vibegron  (GEMTESA ) 75 MG TABS Take 1 tablet (75 mg total) by mouth  daily.   No facility-administered medications prior to visit.    Review of Systems {Insert previous labs (optional):23779} {See past labs  Heme  Chem  Endocrine  Serology  Results Review (optional):1}  Objective    There were no vitals taken for this visit. {Insert last BP/Wt (optional):23777}{See vitals history (optional):1}  Physical Exam  ***  Last depression screening scores    02/02/2024    9:48 AM  PHQ 2/9 Scores  PHQ - 2 Score 0   Last fall risk screening    02/02/2024    9:48 AM  Fall Risk   Falls in the past year? 0  Number falls in past yr: 0  Injury with Fall? 0  Risk for fall due to : No Fall Risks  Follow up Falls evaluation completed   Last Audit-C alcohol use screening    02/02/2024    7:38 AM  Alcohol Use Disorder Test (AUDIT)  1. How often do you have a drink  containing alcohol? 2  2. How many drinks containing alcohol do you have on a typical day when you are drinking? 1  3. How often do you have six or more drinks on one occasion? 1  AUDIT-C Score 4   4. How often during the last year have you found that you were not able to stop drinking once you had started? 0  5. How often during the last year have you failed to do what was normally expected from you because of drinking? 0  6. How often during the last year have you needed a first drink in the morning to get yourself going after a heavy drinking session? 0  7. How often during the last year have you had a feeling of guilt of remorse after drinking? 0  8. How often during the last year have you been unable to remember what happened the night before because you had been drinking? 0  9. Have you or someone else been injured as a result of your drinking? 0  10. Has a relative or friend or a doctor or another health worker been concerned about your drinking or suggested you cut down? 0  Alcohol Use Disorder Identification Test Final Score (AUDIT) 4      Patient-reported   A score of 3 or more in women, and 4 or more in men indicates increased risk for alcohol abuse, EXCEPT if all of the points are from question 1   No results found for any visits on 05/02/24.  Assessment & Plan    Routine Health Maintenance and Physical Exam  Exercise Activities and Dietary recommendations  Goals   None    --balanced diet high in fiber and protein, low in sugars, carbs, fats. --physical activity/exercise 20-30 minutes 3-5 times a week    Immunization History  Administered Date(s) Administered   Tdap 01/30/2013    Health Maintenance  Topic Date Due   COVID-19 Vaccine (1) Never done   HIV Screening  Never done   Hepatitis C Screening  Never done   Hepatitis B Vaccines (1 of 3 - 19+ 3-dose series) Never done   HPV VACCINES (1 - 3-dose SCDM series) Never done   DTaP/Tdap/Td (2 - Td or Tdap)  01/31/2023   INFLUENZA VACCINE  05/26/2024   Meningococcal B Vaccine  Aged Out    Discussed health benefits of physical activity, and encouraged him to engage in regular exercise appropriate for his age and condition.  ***  No follow-ups on file.  Manuelita Flatness, PA-C  [facility] at [facility] [phone] (phone) [phone] (fax)  [facility]

## 2024-05-02 ENCOUNTER — Ambulatory Visit (INDEPENDENT_AMBULATORY_CARE_PROVIDER_SITE_OTHER): Admitting: Physician Assistant

## 2024-05-02 ENCOUNTER — Encounter: Payer: Self-pay | Admitting: Physician Assistant

## 2024-05-02 VITALS — BP 130/73 | HR 59 | Ht 73.0 in | Wt 226.0 lb

## 2024-05-02 DIAGNOSIS — Z23 Encounter for immunization: Secondary | ICD-10-CM | POA: Diagnosis not present

## 2024-05-02 DIAGNOSIS — E559 Vitamin D deficiency, unspecified: Secondary | ICD-10-CM | POA: Diagnosis not present

## 2024-05-02 DIAGNOSIS — Z0000 Encounter for general adult medical examination without abnormal findings: Secondary | ICD-10-CM

## 2024-05-02 DIAGNOSIS — R030 Elevated blood-pressure reading, without diagnosis of hypertension: Secondary | ICD-10-CM

## 2024-05-03 ENCOUNTER — Encounter: Admitting: Physician Assistant

## 2024-05-05 ENCOUNTER — Ambulatory Visit: Payer: Self-pay | Admitting: Physician Assistant

## 2024-05-05 ENCOUNTER — Other Ambulatory Visit (INDEPENDENT_AMBULATORY_CARE_PROVIDER_SITE_OTHER)

## 2024-05-05 DIAGNOSIS — E559 Vitamin D deficiency, unspecified: Secondary | ICD-10-CM | POA: Diagnosis not present

## 2024-05-05 DIAGNOSIS — Z0000 Encounter for general adult medical examination without abnormal findings: Secondary | ICD-10-CM

## 2024-05-05 LAB — HEMOGLOBIN A1C: Hgb A1c MFr Bld: 5 % (ref 4.6–6.5)

## 2024-05-05 LAB — CBC WITH DIFFERENTIAL/PLATELET
Basophils Absolute: 0 K/uL (ref 0.0–0.1)
Basophils Relative: 0.5 % (ref 0.0–3.0)
Eosinophils Absolute: 0.2 K/uL (ref 0.0–0.7)
Eosinophils Relative: 2.2 % (ref 0.0–5.0)
HCT: 46.3 % (ref 39.0–52.0)
Hemoglobin: 16.5 g/dL (ref 13.0–17.0)
Lymphocytes Relative: 17.1 % (ref 12.0–46.0)
Lymphs Abs: 1.4 K/uL (ref 0.7–4.0)
MCHC: 35.6 g/dL (ref 30.0–36.0)
MCV: 88.5 fl (ref 78.0–100.0)
Monocytes Absolute: 0.8 K/uL (ref 0.1–1.0)
Monocytes Relative: 9.3 % (ref 3.0–12.0)
Neutro Abs: 5.9 K/uL (ref 1.4–7.7)
Neutrophils Relative %: 70.9 % (ref 43.0–77.0)
Platelets: 235 K/uL (ref 150.0–400.0)
RBC: 5.24 Mil/uL (ref 4.22–5.81)
RDW: 13.6 % (ref 11.5–15.5)
WBC: 8.3 K/uL (ref 4.0–10.5)

## 2024-05-05 LAB — LIPID PANEL
Cholesterol: 174 mg/dL (ref 0–200)
HDL: 32.4 mg/dL — ABNORMAL LOW (ref >39.00)
LDL Cholesterol: 107 mg/dL — ABNORMAL HIGH (ref 0–99)
NonHDL: 141.36
Total CHOL/HDL Ratio: 5
Triglycerides: 173 mg/dL — ABNORMAL HIGH (ref 0.0–149.0)
VLDL: 34.6 mg/dL (ref 0.0–40.0)

## 2024-05-05 LAB — COMPREHENSIVE METABOLIC PANEL WITH GFR
ALT: 35 U/L (ref 0–53)
AST: 25 U/L (ref 0–37)
Albumin: 5 g/dL (ref 3.5–5.2)
Alkaline Phosphatase: 50 U/L (ref 39–117)
BUN: 26 mg/dL — ABNORMAL HIGH (ref 6–23)
CO2: 28 meq/L (ref 19–32)
Calcium: 9.6 mg/dL (ref 8.4–10.5)
Chloride: 102 meq/L (ref 96–112)
Creatinine, Ser: 1.32 mg/dL (ref 0.40–1.50)
GFR: 67.01 mL/min (ref >60.00)
Glucose, Bld: 99 mg/dL (ref 70–99)
Potassium: 4.2 meq/L (ref 3.5–5.1)
Sodium: 137 meq/L (ref 135–145)
Total Bilirubin: 1.1 mg/dL (ref 0.2–1.2)
Total Protein: 7.4 g/dL (ref 6.0–8.3)

## 2024-05-05 LAB — VITAMIN D 25 HYDROXY (VIT D DEFICIENCY, FRACTURES): VITD: 37.37 ng/mL (ref 30.00–100.00)

## 2024-06-01 ENCOUNTER — Encounter: Payer: Self-pay | Admitting: Physician Assistant

## 2024-06-02 ENCOUNTER — Other Ambulatory Visit: Payer: Self-pay | Admitting: Physician Assistant

## 2024-06-02 ENCOUNTER — Other Ambulatory Visit (HOSPITAL_BASED_OUTPATIENT_CLINIC_OR_DEPARTMENT_OTHER): Payer: Self-pay

## 2024-06-02 DIAGNOSIS — Z91030 Bee allergy status: Secondary | ICD-10-CM

## 2024-06-02 MED ORDER — EPINEPHRINE 0.3 MG/0.3ML IJ SOAJ
0.3000 mg | INTRAMUSCULAR | 1 refills | Status: AC | PRN
  Filled 2024-06-02: qty 2, 30d supply, fill #0

## 2024-06-07 ENCOUNTER — Other Ambulatory Visit (HOSPITAL_BASED_OUTPATIENT_CLINIC_OR_DEPARTMENT_OTHER): Payer: Self-pay

## 2024-09-05 DIAGNOSIS — L570 Actinic keratosis: Secondary | ICD-10-CM | POA: Diagnosis not present

## 2024-09-05 DIAGNOSIS — L738 Other specified follicular disorders: Secondary | ICD-10-CM | POA: Diagnosis not present

## 2024-09-05 DIAGNOSIS — L814 Other melanin hyperpigmentation: Secondary | ICD-10-CM | POA: Diagnosis not present

## 2024-09-05 DIAGNOSIS — B078 Other viral warts: Secondary | ICD-10-CM | POA: Diagnosis not present

## 2024-09-05 DIAGNOSIS — D170 Benign lipomatous neoplasm of skin and subcutaneous tissue of head, face and neck: Secondary | ICD-10-CM | POA: Diagnosis not present
